# Patient Record
Sex: Male | Born: 1950 | Race: White | Hispanic: No | Marital: Married
Health system: Southern US, Community
[De-identification: ages and names within clinical notes are randomized; demographics above are authoritative.]

---

## 2010-03-30 ENCOUNTER — Ambulatory Visit: Payer: Self-pay | Admitting: Internal Medicine

## 2012-08-14 ENCOUNTER — Ambulatory Visit: Payer: Self-pay | Admitting: Gastroenterology

## 2012-08-14 LAB — PLATELET COUNT: Platelet: 215 10*3/uL (ref 150–440)

## 2012-08-14 LAB — PROTIME-INR: Prothrombin Time: 12.8 secs (ref 11.5–14.7)

## 2014-06-16 IMAGING — US ULTRASOUND CORE BIOPSY
1 series · 13 of 15 positions shown · non-contrast
Comparison: none

REASON FOR EXAM: hepatitis C
COMMENTS:

[Series 1: ultrasound core biopsy · 0.28mm/px · 15 acquisitions, 13 frames shown]
[im 1/15]
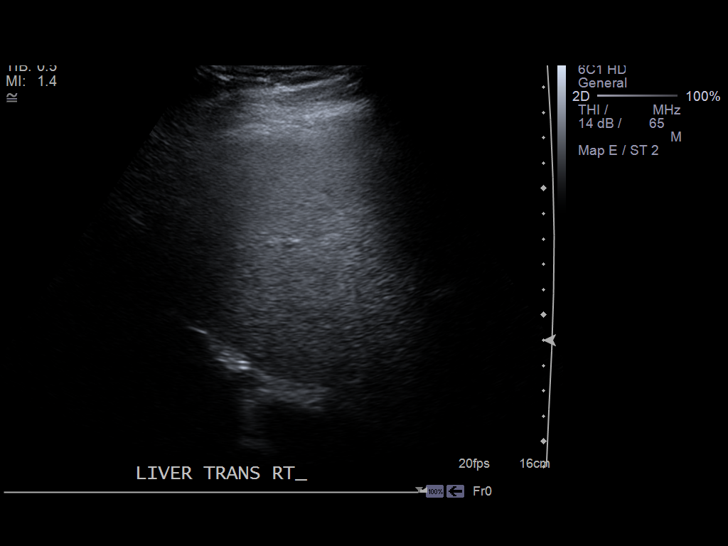
[im 2/15]
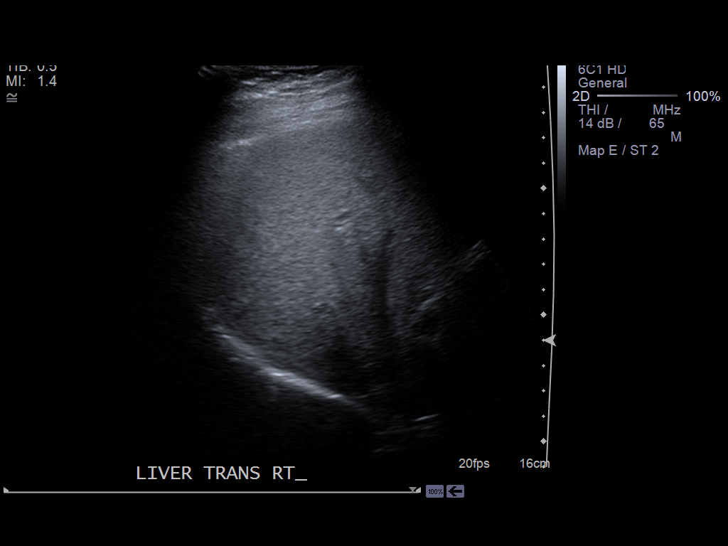
[im 3/15]
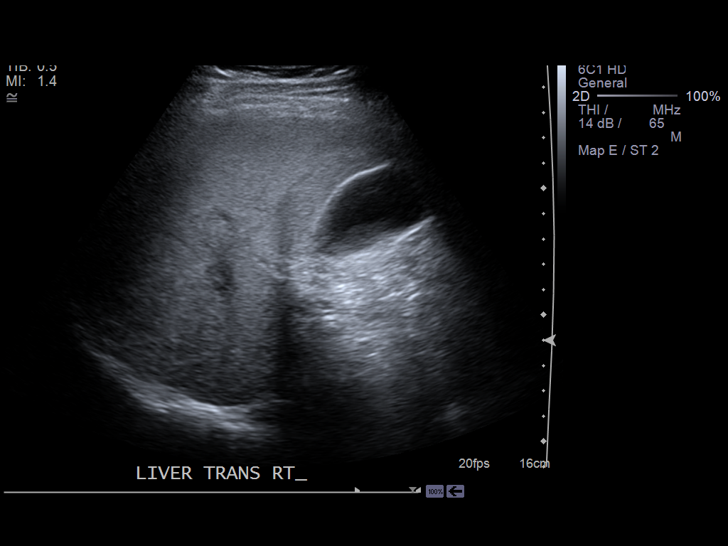
[im 5/15]
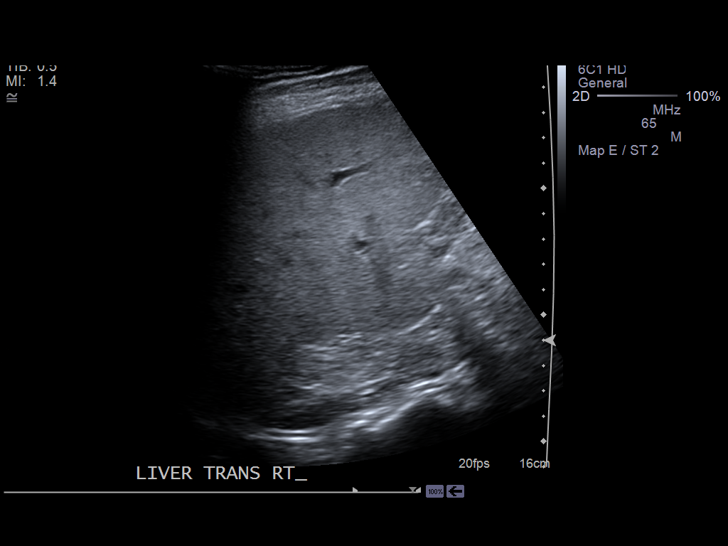
[im 6/15]
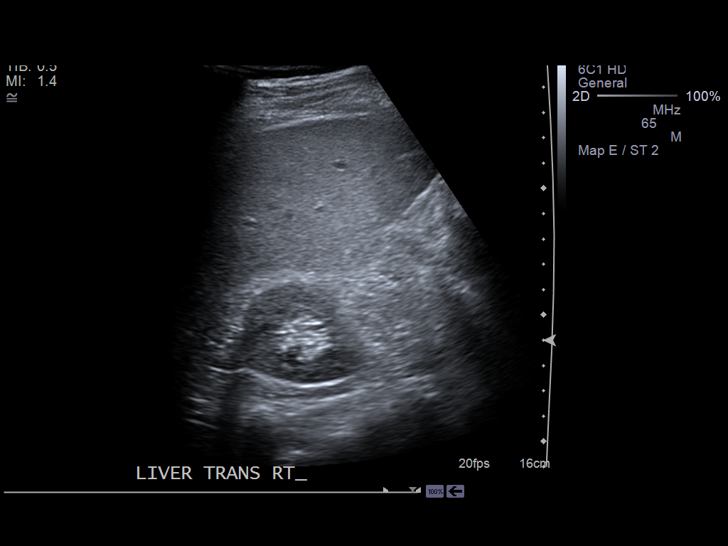
[im 7/15]
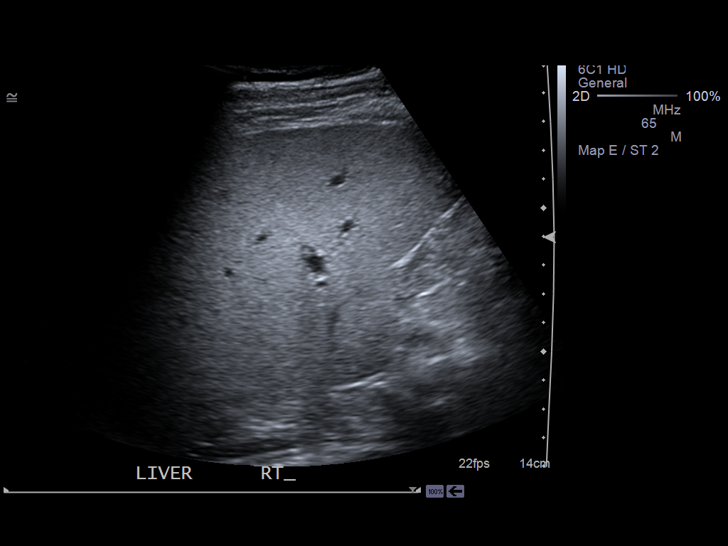
[im 8/15]
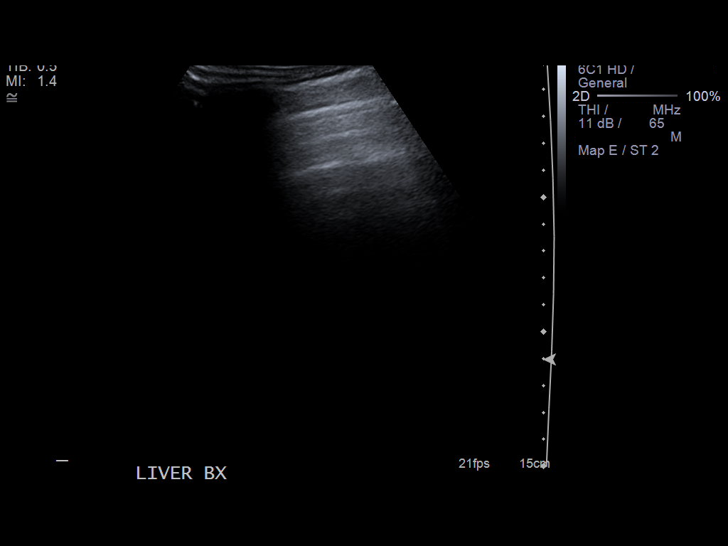
[im 9/15]
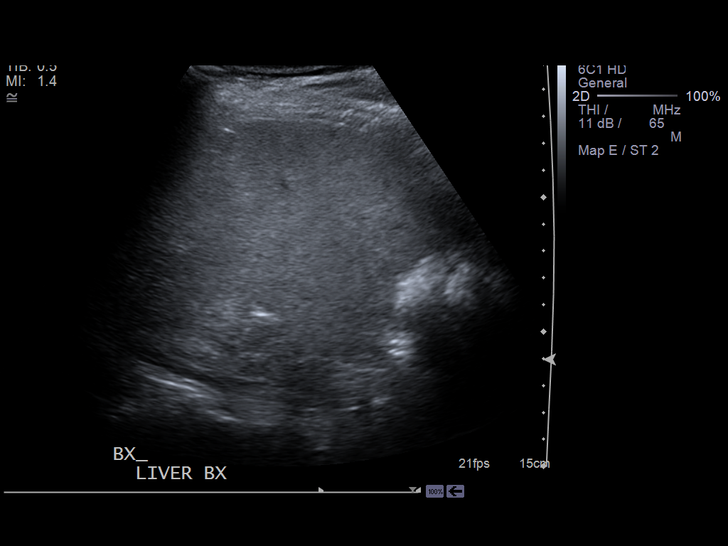
[im 10/15]
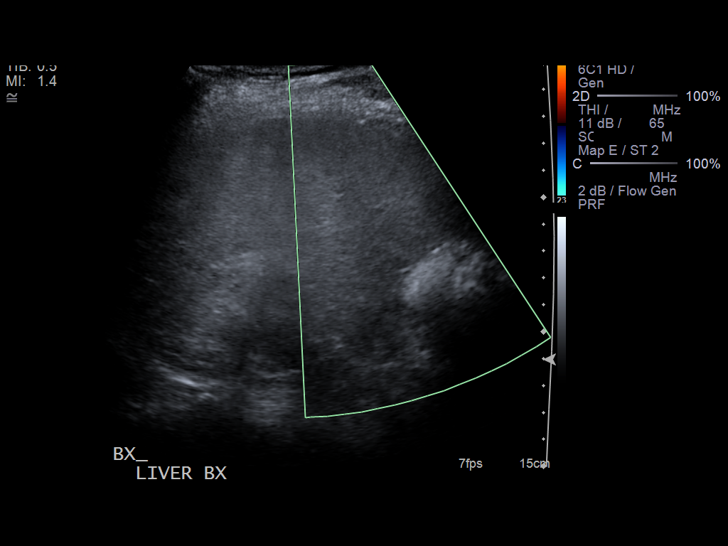
[im 11/15]
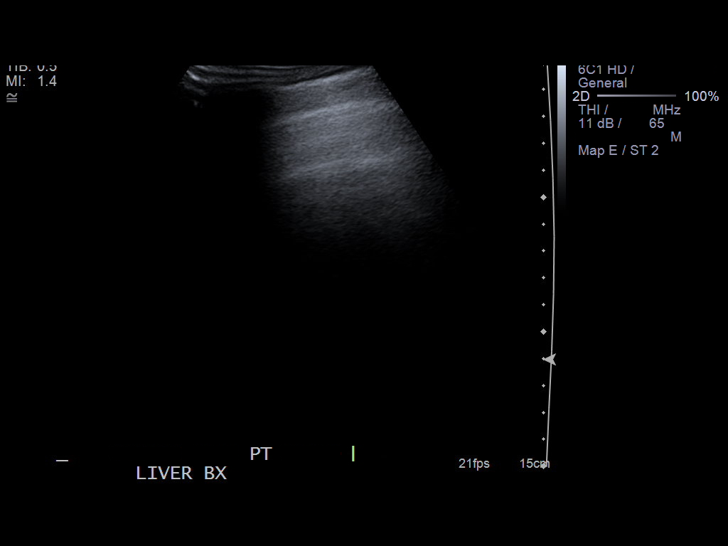
[im 13/15]
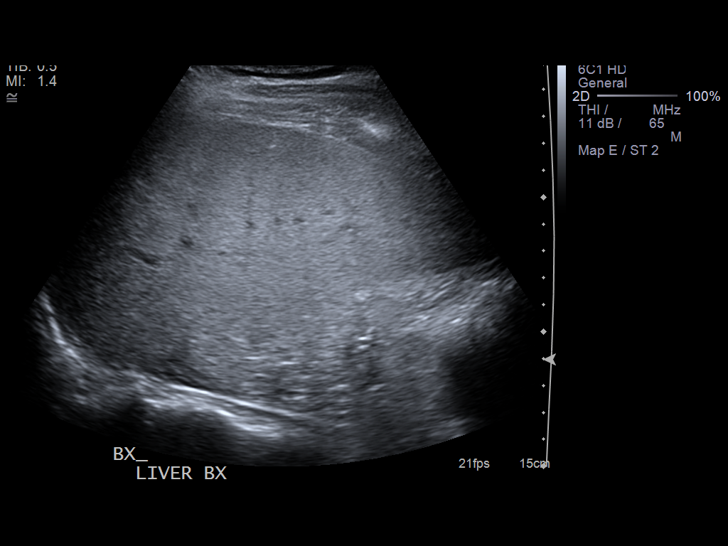
[im 14/15]
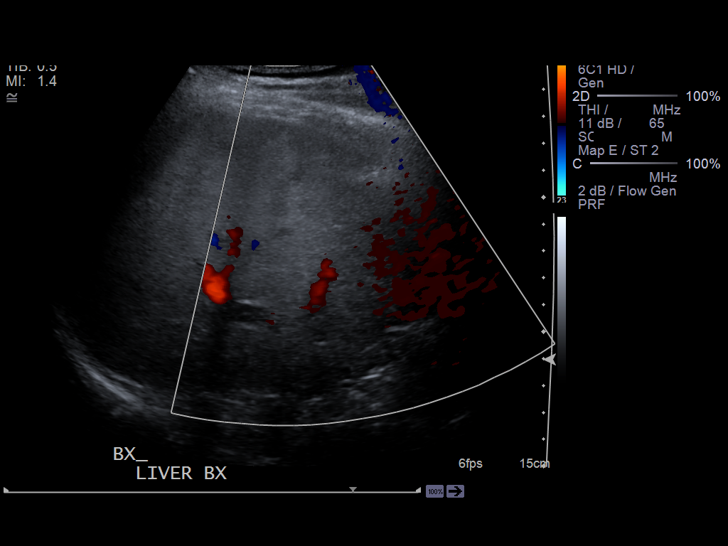
[im 15/15]
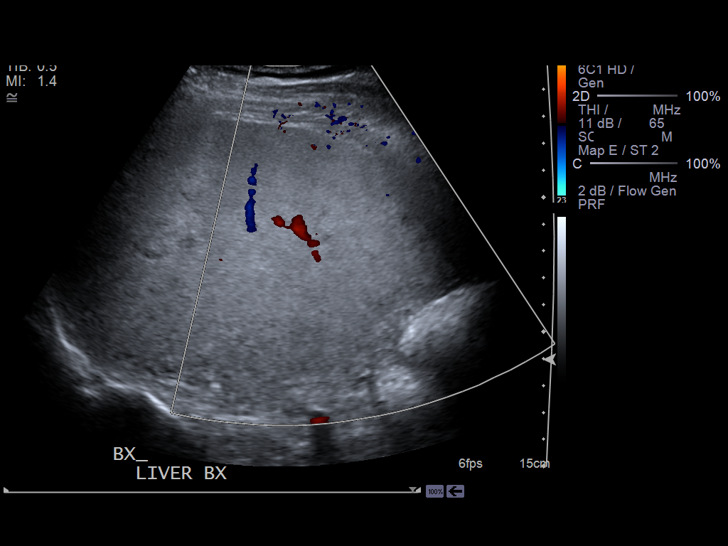

[13 of 15 positions shown; findings below may reference images not displayed]

PROCEDURE:     US  - US GUIDED BX/ASPIRATION NOT BR  - August 14, 2012  [DATE]

RESULT:     Comparisons:  None

Procedure: Clinical assessment was performed and informed consent obtained.
The patient was brought to the ultrasonography suite and positioned supine.
A focused right upper quadrant ultrasound was performed which demonstrated
no focal hepatic abnormality.

The right flank and abdomen were prepped and draped in the usual sterile
manner. The skin and planned biopsy tract to the liver capsule were
anesthetized with 3 mL 1% lidocaine. Under ultrasound guidance, a 18-gauge
achieve core biopsy needle was advanced into the right hepatic lobe. A path
was chosen to avoid the hepatic vessels. One core biopsy of the right
hepatic lobe was obtained with an 18 gauge Achieve and submitted to
pathology.

Hemostasis was achieved. The puncture site was cleansed and dressed with a
sterile bandage. The procedure was well tolerated and without complication.
The patient was transferred to the recovery unit in stable condition.

Medications: 50 mcg fentanyl.
IMPRESSION: Uncomplicated ultrasound guided liver core-biopsy of the right hepatic lobe.

## 2015-10-14 ENCOUNTER — Telehealth: Payer: Self-pay | Admitting: *Deleted

## 2015-10-14 NOTE — Telephone Encounter (Signed)
Lm on vm informing the pt that his appt for 10/21/2015 @ 10am has been cancelled I ask him to give me a call back so that I can get him r/s...thanks

## 2015-10-21 ENCOUNTER — Ambulatory Visit: Payer: BC Managed Care – PPO | Admitting: Anesthesiology
